# Patient Record
Sex: Male | Born: 1980 | Race: White | Hispanic: No | Marital: Married | Smoking: Current every day smoker
Health system: Southern US, Community
[De-identification: ages and names within clinical notes are randomized; demographics above are authoritative.]

## PROBLEM LIST (undated history)

## (undated) HISTORY — PX: MIDDLE EAR SURGERY: SHX713

## (undated) HISTORY — PX: HERNIA REPAIR: SHX51

## (undated) HISTORY — PX: FOOT SURGERY: SHX648

---

## 2015-01-18 ENCOUNTER — Telehealth: Payer: Self-pay | Admitting: Family Medicine

## 2015-01-18 ENCOUNTER — Ambulatory Visit (INDEPENDENT_AMBULATORY_CARE_PROVIDER_SITE_OTHER): Payer: BLUE CROSS/BLUE SHIELD | Admitting: Physician Assistant

## 2015-01-18 ENCOUNTER — Encounter (INDEPENDENT_AMBULATORY_CARE_PROVIDER_SITE_OTHER): Payer: Self-pay

## 2015-01-18 ENCOUNTER — Ambulatory Visit (INDEPENDENT_AMBULATORY_CARE_PROVIDER_SITE_OTHER): Payer: BLUE CROSS/BLUE SHIELD

## 2015-01-18 ENCOUNTER — Encounter: Payer: Self-pay | Admitting: Physician Assistant

## 2015-01-18 VITALS — BP 137/83 | HR 94 | Temp 97.3°F | Ht 69.0 in | Wt 155.0 lb

## 2015-01-18 DIAGNOSIS — X501XXA Overexertion from prolonged static or awkward postures, initial encounter: Secondary | ICD-10-CM

## 2015-01-18 DIAGNOSIS — T1490 Injury, unspecified: Secondary | ICD-10-CM

## 2015-01-18 DIAGNOSIS — M25561 Pain in right knee: Secondary | ICD-10-CM

## 2015-01-18 DIAGNOSIS — M25461 Effusion, right knee: Secondary | ICD-10-CM | POA: Diagnosis not present

## 2015-01-18 MED ORDER — MELOXICAM 15 MG PO TABS: 15.0000 mg | ORAL_TABLET | Freq: Every day | ORAL | Status: DC

## 2015-01-18 NOTE — Patient Instructions (Signed)

## 2015-01-18 NOTE — Telephone Encounter (Signed)
Appt given for today per patients request 

## 2015-01-18 NOTE — Progress Notes (Signed)
   Subjective:    Patient ID: Corey Dodson, male    DOB: April 04, 1981, 34 y.o.   MRN: [id number]  HPI 34 y/o male presents with right knee pain and swelling s/p an incident x 2 days ago while he was on a roof. He went to grab shingles and his "knee cap rolled like bones grinding together". No immediate pain. Few hours later he noticed mild pain, took tylenol. Woke up yesterday morning with sever pain and swelling in right knee. He presents on crutches today. Pain is worse with standing or leg extension. Has took 8 375mg  Tylenol today with no relief. Applied ice several times yesterday . No h/o knee injury.    Review of Systems  Constitutional: Negative.   Musculoskeletal: Positive for joint swelling (right knee edema) and arthralgias (right knee ).       Unable to extend knee   Neurological: Positive for numbness (intermittent numbness in anterior thigh, superior lateral pole of patella ).       Objective:   Physical Exam  Musculoskeletal: He exhibits edema (2+ edema superior to patella into quadriceps of RLE) and tenderness (lateral superior pole of patella, ).  Inability to perform right knee extension or flexion d/t pain in inferior quadricep Negative for patella sublaxation           Assessment & Plan:  1. Injury caused by twisting  - DG Knee 1-2 Views Right; Future - MR Knee Right Wo Contrast; Future - meloxicam (MOBIC) 15 MG tablet; Take 1 tablet (15 mg total) by mouth daily.  Dispense: 30 tablet; Refill: 0  2. Right knee pain  - MR Knee Right Wo Contrast; Future - meloxicam (MOBIC) 15 MG tablet; Take 1 tablet (15 mg total) by mouth daily.  Dispense: 30 tablet; Refill: 0 - Continue using crutches - Rest, ice , ace wrap and elevation   3. Knee effusion, right  - MR Knee Right Wo Contrast; Future - meloxicam (MOBIC) 15 MG tablet; Take 1 tablet (15 mg total) by mouth daily.  Dispense: 30 tablet; Refill: 0  Will call with appointment for MRI    RTO 2 weeks    Skarlet Lyons A. Chauncey Reading PA-C

## 2015-02-10 ENCOUNTER — Ambulatory Visit (HOSPITAL_COMMUNITY): Payer: BLUE CROSS/BLUE SHIELD

## 2015-11-14 IMAGING — CR DG KNEE 1-2V*R*
2 series · 2 of 2 positions shown · non-contrast
Comparison: None.

CLINICAL DATA: The patient felt a pop in his right knee 01/16/2015.
Right knee pain. Initial encounter.

EXAM:
RIGHT KNEE - 1-2 VIEW

[view not recorded (1 of 2)]
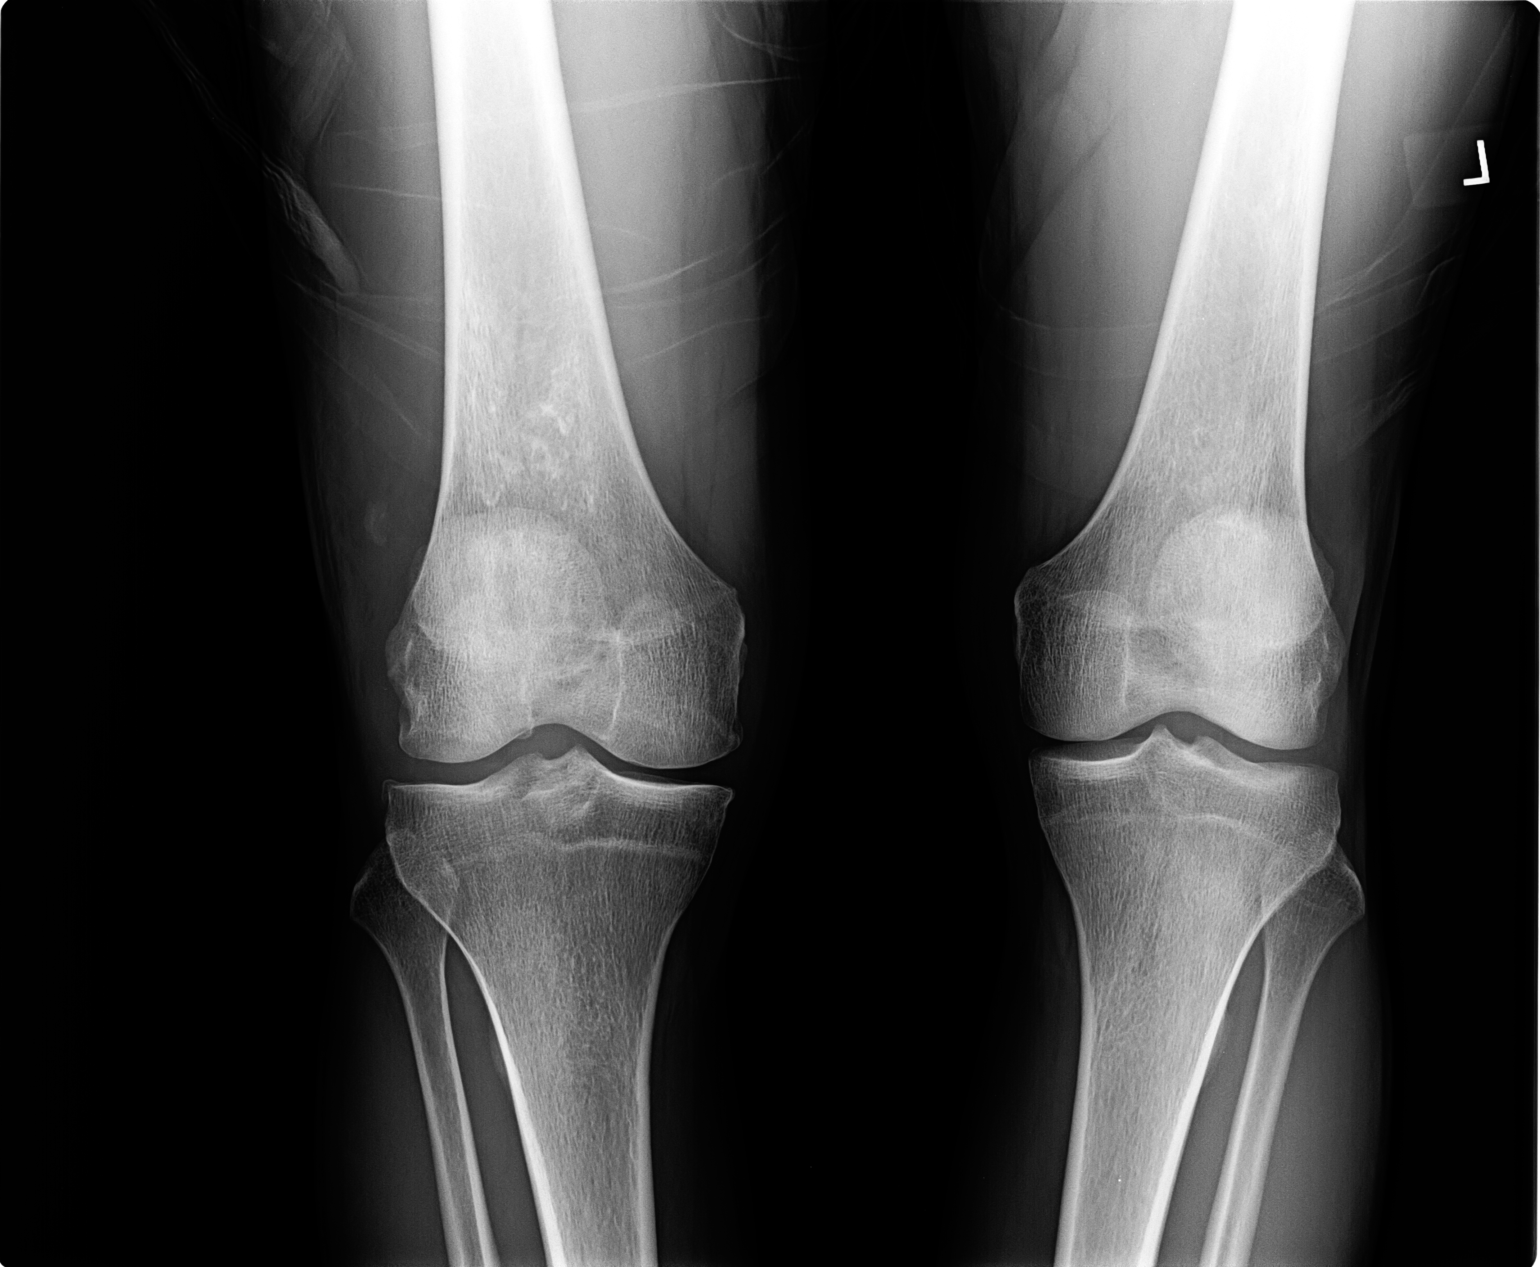

[view not recorded (2 of 2)]
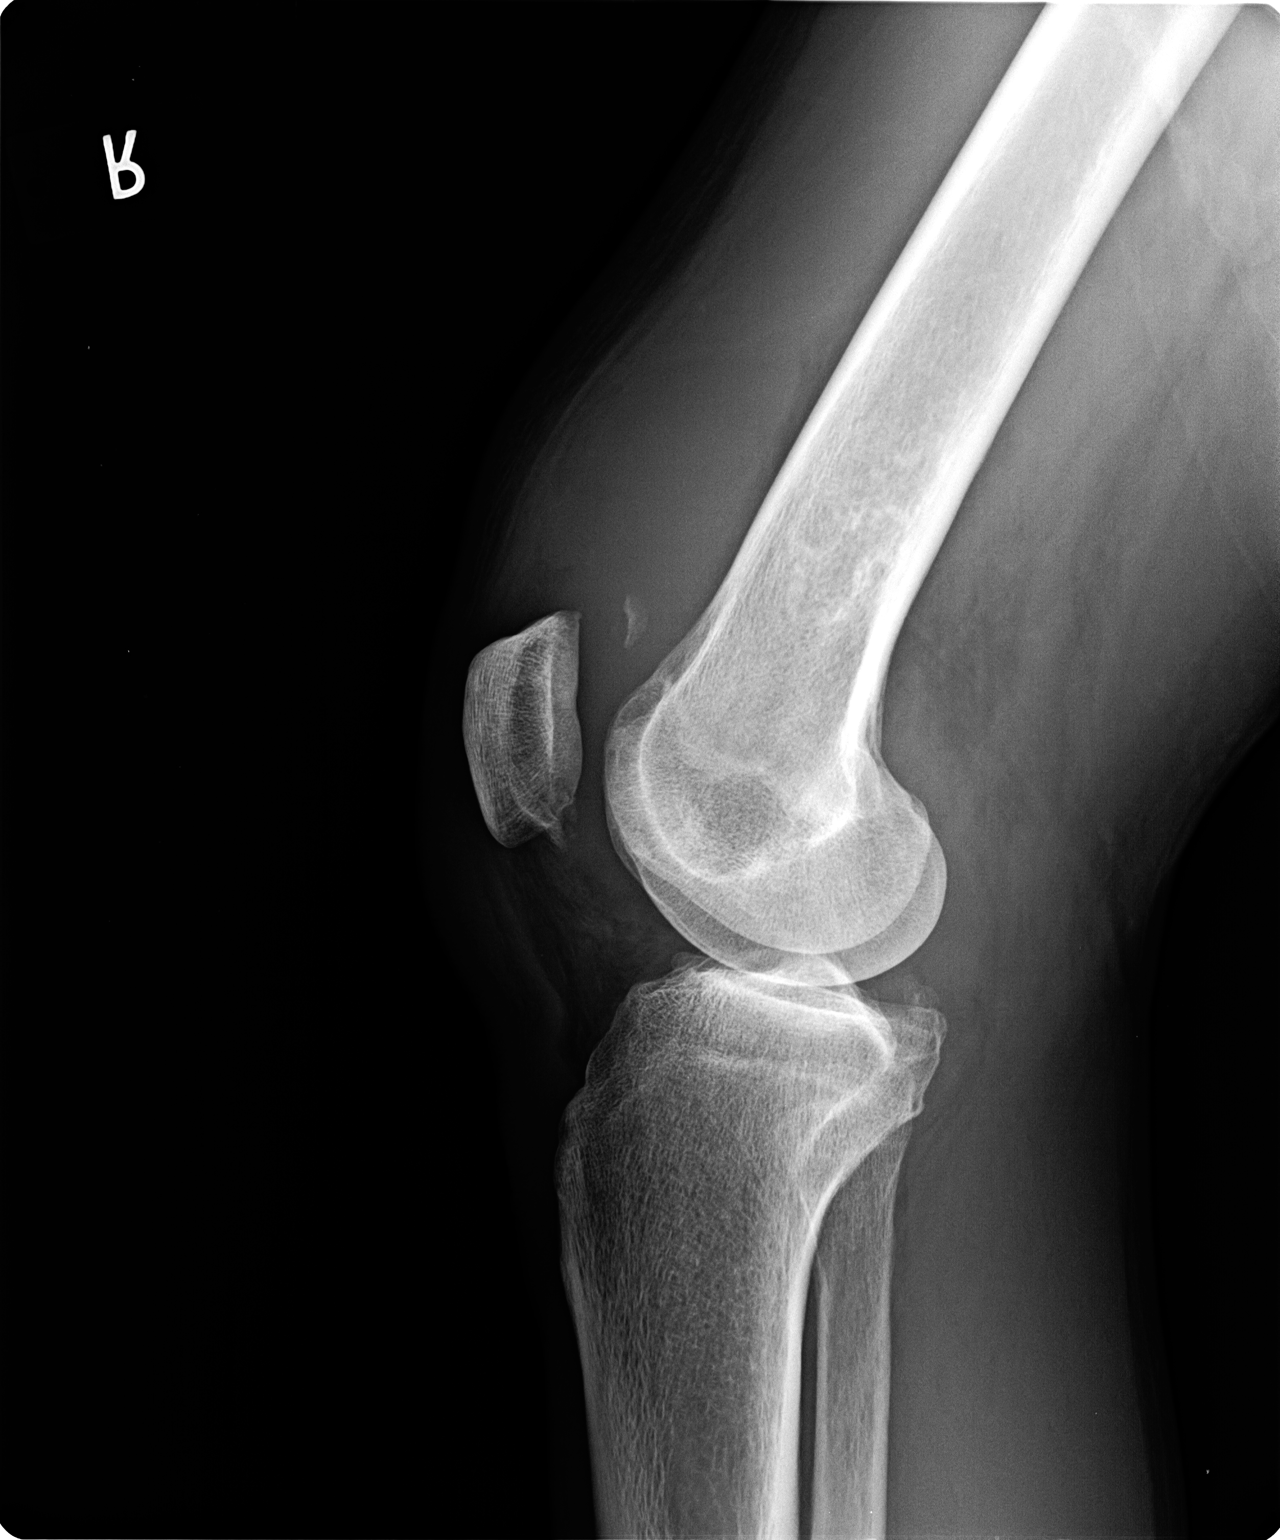

[2 of 2 positions shown; findings below may reference images not displayed]

FINDINGS: The patient has a large right knee joint effusion. A loose body
projecting along the lateral aspect of the joint in the
patellofemoral compartment measures 1 cm in diameter. No fracture is
identified. There is a subtle area of sclerosis in the medullary
space of the distal left femur with an appearance most consistent
with a medullary infarct. Joint spaces are preserved. Small
osteophytes are seen about the knee.
IMPRESSION: Large knee joint effusion.  No fracture is identified.

1 cm loose body projects in the lateral aspect of the patellofemoral
compartment.

Likely medullary infarct distal right femur.

## 2017-06-24 ENCOUNTER — Encounter: Payer: BLUE CROSS/BLUE SHIELD | Admitting: Family Medicine

## 2017-06-25 ENCOUNTER — Encounter: Payer: Self-pay | Admitting: Pediatrics

## 2018-05-28 DIAGNOSIS — Z23 Encounter for immunization: Secondary | ICD-10-CM | POA: Diagnosis not present

## 2018-10-28 ENCOUNTER — Telehealth: Payer: Self-pay | Admitting: *Deleted

## 2018-10-28 NOTE — Telephone Encounter (Signed)
Patient called stating he has noticed small amounts of blood in his stool.  He states it has happened intermittently, but the last time was 2 years ago.  He has a history of hemorrhoids.  He has not been seen here in 3.5 years.  Scheduled him as a new patient tomorrow to be evaluated by Dr. Louanne Skye.

## 2018-10-29 ENCOUNTER — Ambulatory Visit: Payer: Medicaid Other | Admitting: Family Medicine

## 2018-10-29 ENCOUNTER — Other Ambulatory Visit: Payer: Self-pay

## 2018-10-29 ENCOUNTER — Encounter: Payer: Self-pay | Admitting: Family Medicine

## 2018-10-29 VITALS — BP 115/82 | HR 78 | Temp 97.6°F | Ht 69.0 in | Wt 161.0 lb

## 2018-10-29 DIAGNOSIS — Z131 Encounter for screening for diabetes mellitus: Secondary | ICD-10-CM

## 2018-10-29 DIAGNOSIS — Z13220 Encounter for screening for lipoid disorders: Secondary | ICD-10-CM

## 2018-10-29 DIAGNOSIS — K640 First degree hemorrhoids: Secondary | ICD-10-CM | POA: Diagnosis not present
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
## 2018-10-30 LAB — LIPID PANEL
Chol/HDL Ratio: 4.8 ratio (ref 0.0–5.0)
Cholesterol, Total: 158 mg/dL (ref 100–199)
HDL: 33 mg/dL — ABNORMAL LOW (ref >39)
LDL Calculated: 95 mg/dL (ref 0–99)
Triglycerides: 152 mg/dL — ABNORMAL HIGH (ref 0–149)
VLDL CHOLESTEROL CAL: 30 mg/dL (ref 5–40)

## 2018-10-30 LAB — CMP14+EGFR
A/G RATIO: 2.7 — AB (ref 1.2–2.2)
ALBUMIN: 4.8 g/dL (ref 4.0–5.0)
ALT: 21 IU/L (ref 0–44)
AST: 26 IU/L (ref 0–40)
Alkaline Phosphatase: 103 IU/L (ref 39–117)
BILIRUBIN TOTAL: 0.4 mg/dL (ref 0.0–1.2)
BUN / CREAT RATIO: 12 (ref 9–20)
BUN: 12 mg/dL (ref 6–20)
CHLORIDE: 101 mmol/L (ref 96–106)
CO2: 24 mmol/L (ref 20–29)
Calcium: 9.5 mg/dL (ref 8.7–10.2)
Creatinine, Ser: 1.04 mg/dL (ref 0.76–1.27)
GFR calc Af Amer: 104 mL/min/{1.73_m2} (ref >59)
GFR calc non Af Amer: 90 mL/min/{1.73_m2} (ref >59)
Globulin, Total: 1.8 g/dL (ref 1.5–4.5)
Glucose: 82 mg/dL (ref 65–99)
POTASSIUM: 4.3 mmol/L (ref 3.5–5.2)
Sodium: 143 mmol/L (ref 134–144)
TOTAL PROTEIN: 6.6 g/dL (ref 6.0–8.5)

## 2018-10-30 LAB — CBC WITH DIFFERENTIAL/PLATELET
BASOS: 1 %
Basophils Absolute: 0.1 10*3/uL (ref 0.0–0.2)
EOS (ABSOLUTE): 0.1 10*3/uL (ref 0.0–0.4)
Eos: 1 %
HEMOGLOBIN: 15.9 g/dL (ref 13.0–17.7)
Hematocrit: 45.8 % (ref 37.5–51.0)
IMMATURE GRANS (ABS): 0 10*3/uL (ref 0.0–0.1)
Immature Granulocytes: 0 %
LYMPHS ABS: 2.4 10*3/uL (ref 0.7–3.1)
LYMPHS: 28 %
MCH: 29.2 pg (ref 26.6–33.0)
MCHC: 34.7 g/dL (ref 31.5–35.7)
MCV: 84 fL (ref 79–97)
MONOCYTES: 6 %
Monocytes Absolute: 0.5 10*3/uL (ref 0.1–0.9)
NEUTROS ABS: 5.6 10*3/uL (ref 1.4–7.0)
Neutrophils: 64 %
Platelets: 315 10*3/uL (ref 150–450)
RBC: 5.44 x10E6/uL (ref 4.14–5.80)
RDW: 12.5 % (ref 11.6–15.4)
WBC: 8.7 10*3/uL (ref 3.4–10.8)

## 2019-05-17 DIAGNOSIS — Z23 Encounter for immunization: Secondary | ICD-10-CM | POA: Diagnosis not present

## 2020-01-15 DIAGNOSIS — R05 Cough: Secondary | ICD-10-CM | POA: Diagnosis not present

## 2020-01-15 DIAGNOSIS — J029 Acute pharyngitis, unspecified: Secondary | ICD-10-CM | POA: Diagnosis not present

## 2020-01-18 ENCOUNTER — Ambulatory Visit (INDEPENDENT_AMBULATORY_CARE_PROVIDER_SITE_OTHER): Payer: Medicaid Other | Admitting: Nurse Practitioner

## 2020-01-18 ENCOUNTER — Encounter: Payer: Self-pay | Admitting: Nurse Practitioner

## 2020-01-18 DIAGNOSIS — J069 Acute upper respiratory infection, unspecified: Secondary | ICD-10-CM

## 2020-01-18 MED ORDER — FLUTICASONE PROPIONATE 50 MCG/ACT NA SUSP: 2.0000 | Freq: Every day | NASAL | 6 refills | Status: DC

## 2020-01-18 NOTE — Progress Notes (Signed)
Virtual Visit via telephone Note Due to COVID-19 pandemic this visit was conducted virtually. This visit type was conducted due to national recommendations for restrictions regarding the COVID-19 Pandemic (e.g. social distancing, sheltering in place) in an effort to limit this patient's exposure and mitigate transmission in our community. All issues noted in this document were discussed and addressed.  A physical exam was not performed with this format.  I connected with Arlan Organ on 01/18/20 at 8:35 by telephone and verified that I am speaking with the correct person using two identifiers. Tovia Kisner is currently located at home and no one is currently with  him during visit. The provider, Mary-Margaret Daphine Deutscher, FNP is located in their office at time of visit.  I discussed the limitations, risks, security and privacy concerns of performing an evaluation and management service by telephone and the availability of in person appointments. I also discussed with the patient that there may be a patient responsible charge related to this service. The patient expressed understanding and agreed to proceed.   History and Present Illness:   Chief Complaint: URI   HPI Patient calls in c/o fever for 4-5 days  with ear ache. Slight cough. Slight congestion.  Started about 3 days ago   Review of Systems  Constitutional: Negative for chills and fever.  HENT: Positive for congestion. Negative for ear pain (just feels stopped up) and sinus pain.   Respiratory: Positive for cough (slight). Negative for sputum production.   Neurological: Negative for dizziness and headaches.  All other systems reviewed and are negative.    Observations/Objective: Alert and oriented- answers all questions appropriately No distress    Assessment and Plan: Arlan Organ in today with chief complaint of URI   1. Acute URI 1. Take meds as prescribed 2. Use a cool mist humidifier especially during the  winter months and when heat has been humid. 3. Use saline nose sprays frequently 4. Saline irrigations of the nose can be very helpful if done frequently.  * 4X daily for 1 week*  * Use of a nettie pot can be helpful with this. Follow directions with this* 5. Drink plenty of fluids 6. Keep thermostat turn down low 7.For any cough or congestion  Use plain Mucinex- regular strength or max strength is fine   * Children- consult with Pharmacist for dosing 8. For fever or aces or pains- take tylenol or ibuprofen appropriate for age and weight.  * for fevers greater than 101 orally you may alternate ibuprofen and tylenol every  3 hours.   Meds ordered this encounter  Medications  . fluticasone (FLONASE) 50 MCG/ACT nasal spray    Sig: Place 2 sprays into both nostrils daily.    Dispense:  16 g    Refill:  6    Order Specific Question:   Supervising Provider    Answer:   Arville Care A [[id number]]           Follow Up Instructions: prn    I discussed the assessment and treatment plan with the patient. The patient was provided an opportunity to ask questions and all were answered. The patient agreed with the plan and demonstrated an understanding of the instructions.   The patient was advised to call back or seek an in-person evaluation if the symptoms worsen or if the condition fails to improve as anticipated.  The above assessment and management plan was discussed with the patient. The patient verbalized understanding of and has agreed  to the management plan. Patient is aware to call the clinic if symptoms persist or worsen. Patient is aware when to return to the clinic for a follow-up visit. Patient educated on when it is appropriate to go to the emergency department.   Time call ended:  8:49  I provided 14 minutes of non-face-to-face time during this encounter.    Mary-Margaret Hassell Done, FNP

## 2020-06-01 ENCOUNTER — Ambulatory Visit (INDEPENDENT_AMBULATORY_CARE_PROVIDER_SITE_OTHER): Payer: Medicaid Other

## 2020-06-01 ENCOUNTER — Other Ambulatory Visit: Payer: Self-pay

## 2020-06-01 DIAGNOSIS — Z23 Encounter for immunization: Secondary | ICD-10-CM

## 2020-06-01 NOTE — Progress Notes (Signed)
° °  Covid-19 Vaccination Clinic  Name:  Corey Dodson    MRN: [id number] DOB: [birth year]  06/01/2020  [name] was observed post Covid-19 immunization for 15 minutes without incident. He was provided with Vaccine Information Sheet and instruction to access the V-Safe system.   [name] was instructed to call 911 with any severe reactions post vaccine:  Difficulty breathing   Swelling of face and throat   A fast heartbeat   A bad rash all over body   Dizziness and weakness   Immunizations Administered    Name Date Dose VIS Date Route   Moderna COVID-19 Vaccine 06/01/2020  1:42 PM 0.5 mL 07/2019 Intramuscular   Manufacturer: Moderna   Lot: [id number]   NDC: 55974-163-84

## 2020-07-06 ENCOUNTER — Ambulatory Visit: Payer: Medicaid Other

## 2020-07-19 DIAGNOSIS — Z20822 Contact with and (suspected) exposure to covid-19: Secondary | ICD-10-CM | POA: Diagnosis not present

## 2020-07-25 DIAGNOSIS — Z20822 Contact with and (suspected) exposure to covid-19: Secondary | ICD-10-CM | POA: Diagnosis not present

## 2020-09-28 ENCOUNTER — Ambulatory Visit: Payer: Medicaid Other

## 2022-01-04 ENCOUNTER — Ambulatory Visit: Payer: Medicaid Other | Admitting: Family Medicine

## 2022-01-04 ENCOUNTER — Encounter: Payer: Self-pay | Admitting: Family Medicine

## 2022-01-04 VITALS — BP 123/75 | HR 78 | Temp 97.9°F | Ht 69.0 in | Wt 164.0 lb

## 2022-01-04 DIAGNOSIS — Z0001 Encounter for general adult medical examination with abnormal findings: Secondary | ICD-10-CM

## 2022-01-04 DIAGNOSIS — R1032 Left lower quadrant pain: Secondary | ICD-10-CM

## 2022-01-04 DIAGNOSIS — Z0000 Encounter for general adult medical examination without abnormal findings: Secondary | ICD-10-CM | POA: Diagnosis not present

## 2022-01-04 DIAGNOSIS — S139XXA Sprain of joints and ligaments of unspecified parts of neck, initial encounter: Secondary | ICD-10-CM

## 2022-01-04 LAB — MICROSCOPIC EXAMINATION
Epithelial Cells (non renal): NONE SEEN /hpf (ref 0–10)
RBC, Urine: NONE SEEN /hpf (ref 0–2)
Renal Epithel, UA: NONE SEEN /hpf

## 2022-01-04 LAB — URINALYSIS, COMPLETE
Bilirubin, UA: NEGATIVE
Glucose, UA: NEGATIVE
Leukocytes,UA: NEGATIVE
Nitrite, UA: NEGATIVE
RBC, UA: NEGATIVE
Specific Gravity, UA: 1.02 (ref 1.005–1.030)
Urobilinogen, Ur: 0.2 mg/dL (ref 0.2–1.0)
pH, UA: 7 (ref 5.0–7.5)

## 2022-01-04 MED ORDER — CYCLOBENZAPRINE HCL 10 MG PO TABS: 10.0000 mg | ORAL_TABLET | Freq: Three times a day (TID) | ORAL | 0 refills | Status: DC | PRN

## 2022-01-04 NOTE — Progress Notes (Signed)
BP 123/75   Pulse 78   Temp 97.9 F (36.6 C)   Ht _0  (1.753 m)   Wt 164 lb (74.4 kg)   SpO2 98%   BMI 24.22 kg/m    Subjective:   Patient ID: Corey Dodson, male    DOB: 12-11-1980, 41 y.o.   MRN: [id number]  HPI: Corey Dodson is a 41 y.o. male presenting on 01/04/2022 for Medical Management of Chronic Issues (CPE)   HPI Physical exam Patient denies any chest pain, shortness of breath, headaches or vision issues, abdominal complaints, diarrhea, nausea, vomiting.  Patient's biggest complaint at this time is his neck pain.  He has been on and off and on for the past couple months.  He says it is worse at night and hurts down both sides of his neck and sometimes comes up and causes headaches as well.  He denies any specific trauma that brought it on.  He has been trying some Tylenol and ibuprofen and they do not seem to be helping.  He does feel a popping and grinding that will then cause him to be sore and have pain.  He is also has intermittent left testicular pain or just above the scrotum where it hurts in his groin and he was concerned about whether or not he has hernia.  He denies any pain with urination or intercourse.  He denies pain in the testicle itself but just above it in his groin.  He says it happens very infrequently over the past year but just that he has noticed it every now and then.  It does not hurt today.  Relevant past medical, surgical, family and social history Reviewed and updated as indicated. Interim medical history since our last visit reviewed. Allergies and medications reviewed and updated.  Review of Systems  Constitutional:  Negative for chills and fever.  Eyes:  Negative for visual disturbance.  Respiratory:  Negative for shortness of breath and wheezing.   Cardiovascular:  Negative for chest pain and leg swelling.  Genitourinary:  Positive for testicular pain. Negative for decreased urine volume, dysuria, frequency, hematuria and urgency.   Musculoskeletal:  Positive for arthralgias, myalgias and neck pain. Negative for back pain, gait problem and neck stiffness.  Skin:  Negative for rash.  Neurological:  Positive for headaches.  All other systems reviewed and are negative.  Per HPI unless specifically indicated above   Allergies as of 01/04/2022   No Known Allergies      Medication List        Accurate as of January 04, 2022  9:58 AM. If you have any questions, ask your nurse or doctor.          STOP taking these medications    fluticasone 50 MCG/ACT nasal spray Commonly known as: FLONASE Stopped by: Fransisca Kaufmann Kaley Jutras, MD       TAKE these medications    acetaminophen 500 MG tablet Commonly known as: TYLENOL Take 500 mg by mouth every 6 (six) hours as needed.   cyclobenzaprine 10 MG tablet Commonly known as: FLEXERIL Take 1 tablet (10 mg total) by mouth 3 (three) times daily as needed for muscle spasms. Started by: Fransisca Kaufmann Masiah Woody, MD         Objective:   BP 123/75   Pulse 78   Temp 97.9 F (36.6 C)   Ht _1  (1.753 m)   Wt 164 lb (74.4 kg)   SpO2 98%   BMI 24.22 kg/m  Wt Readings from Last 3 Encounters:  01/04/22 164 lb (74.4 kg)  10/29/18 161 lb (73 kg)  01/18/15 155 lb (70.3 kg)    Physical Exam Vitals and nursing note reviewed.  Constitutional:      General: He is not in acute distress.    Appearance: He is well-developed. He is not diaphoretic.  Eyes:     General: No scleral icterus.    Conjunctiva/sclera: Conjunctivae normal.  Neck:     Thyroid: No thyromegaly.  Cardiovascular:     Rate and Rhythm: Normal rate and regular rhythm.     Heart sounds: Normal heart sounds. No murmur heard. Pulmonary:     Effort: Pulmonary effort is normal. No respiratory distress.     Breath sounds: Normal breath sounds. No wheezing.  Abdominal:     Hernia: There is no hernia in the left inguinal area or right inguinal area.  Genitourinary:    Testes:        Right: Mass, tenderness,  swelling, testicular hydrocele or varicocele not present. Right testis is descended. Cremasteric reflex is present.         Left: Mass, tenderness, swelling, testicular hydrocele or varicocele not present. Left testis is descended. Cremasteric reflex is present.     Musculoskeletal:        General: Normal range of motion.     Cervical back: Neck supple. Tenderness and crepitus present. No deformity, signs of trauma, spasms, torticollis or bony tenderness. Pain with movement present. Normal range of motion.       Back:  Lymphadenopathy:     Cervical: No cervical adenopathy.  Skin:    General: Skin is warm and dry.     Findings: No rash.  Neurological:     Mental Status: He is alert and oriented to person, place, and time.     Coordination: Coordination normal.  Psychiatric:        Behavior: Behavior normal.      Assessment & Plan:   Problem List Items Addressed This Visit   None Visit Diagnoses     Physical exam    -  Primary   Relevant Orders   CBC with Differential/Platelet   CMP14+EGFR   Lipid panel   Left inguinal pain       Relevant Orders   Urinalysis, Complete   Urine Culture   Neck sprain, initial encounter       Relevant Medications   cyclobenzaprine (FLEXERIL) 10 MG tablet       We will do urinalysis to see if he has any kind of infection from the groin.  Likely muscular strain as well.  Patient's neck is muscular strain, will give muscle relaxer. Follow up plan: Return in about 1 year (around 01/05/2023), or if symptoms worsen or fail to improve, for Physical exam.  Counseling provided for all of the vaccine components Orders Placed This Encounter  Procedures   Urine Culture   Urinalysis, Complete   CBC with Differential/Platelet   CMP14+EGFR   Lipid panel    Caryl Pina, MD [facility] 01/04/2022, 9:58 AM

## 2022-01-05 LAB — CMP14+EGFR
ALT: 17 IU/L (ref 0–44)
AST: 25 IU/L (ref 0–40)
Albumin/Globulin Ratio: 2.2 (ref 1.2–2.2)
Albumin: 4.6 g/dL (ref 4.0–5.0)
Alkaline Phosphatase: 97 IU/L (ref 44–121)
BUN/Creatinine Ratio: 7 — ABNORMAL LOW (ref 9–20)
BUN: 7 mg/dL (ref 6–24)
Bilirubin Total: 0.4 mg/dL (ref 0.0–1.2)
CO2: 20 mmol/L (ref 20–29)
Calcium: 9.4 mg/dL (ref 8.7–10.2)
Chloride: 105 mmol/L (ref 96–106)
Creatinine, Ser: 0.97 mg/dL (ref 0.76–1.27)
Globulin, Total: 2.1 g/dL (ref 1.5–4.5)
Glucose: 99 mg/dL (ref 70–99)
Potassium: 4.2 mmol/L (ref 3.5–5.2)
Sodium: 138 mmol/L (ref 134–144)
Total Protein: 6.7 g/dL (ref 6.0–8.5)
eGFR: 100 mL/min/{1.73_m2} (ref >59)

## 2022-01-05 LAB — CBC WITH DIFFERENTIAL/PLATELET
Basophils Absolute: 0.1 10*3/uL (ref 0.0–0.2)
Basos: 1 %
EOS (ABSOLUTE): 0.3 10*3/uL (ref 0.0–0.4)
Eos: 3 %
Hematocrit: 46.9 % (ref 37.5–51.0)
Hemoglobin: 16.1 g/dL (ref 13.0–17.7)
Immature Grans (Abs): 0 10*3/uL (ref 0.0–0.1)
Immature Granulocytes: 0 %
Lymphocytes Absolute: 2.1 10*3/uL (ref 0.7–3.1)
Lymphs: 20 %
MCH: 29.5 pg (ref 26.6–33.0)
MCHC: 34.3 g/dL (ref 31.5–35.7)
MCV: 86 fL (ref 79–97)
Monocytes Absolute: 0.6 10*3/uL (ref 0.1–0.9)
Monocytes: 6 %
Neutrophils Absolute: 7.3 10*3/uL — ABNORMAL HIGH (ref 1.4–7.0)
Neutrophils: 70 %
Platelets: 251 10*3/uL (ref 150–450)
RBC: 5.46 x10E6/uL (ref 4.14–5.80)
RDW: 12.9 % (ref 11.6–15.4)
WBC: 10.3 10*3/uL (ref 3.4–10.8)

## 2022-01-05 LAB — URINE CULTURE: Organism ID, Bacteria: NO GROWTH

## 2022-01-05 LAB — LIPID PANEL
Chol/HDL Ratio: 5.9 ratio — ABNORMAL HIGH (ref 0.0–5.0)
Cholesterol, Total: 182 mg/dL (ref 100–199)
HDL: 31 mg/dL — ABNORMAL LOW (ref >39)
LDL Chol Calc (NIH): 109 mg/dL — ABNORMAL HIGH (ref 0–99)
Triglycerides: 239 mg/dL — ABNORMAL HIGH (ref 0–149)
VLDL Cholesterol Cal: 42 mg/dL — ABNORMAL HIGH (ref 5–40)

## 2022-01-08 NOTE — Progress Notes (Signed)
Patient returning call. Please call back

## 2022-09-05 ENCOUNTER — Ambulatory Visit: Payer: BC Managed Care – PPO | Admitting: Family Medicine

## 2022-09-06 ENCOUNTER — Encounter: Payer: Self-pay | Admitting: Family Medicine

## 2022-09-06 ENCOUNTER — Ambulatory Visit (INDEPENDENT_AMBULATORY_CARE_PROVIDER_SITE_OTHER): Payer: BC Managed Care – PPO | Admitting: Family Medicine

## 2022-09-06 VITALS — BP 106/56 | HR 85 | Temp 97.4°F | Ht 69.0 in | Wt 156.1 lb

## 2022-09-06 DIAGNOSIS — K439 Ventral hernia without obstruction or gangrene: Secondary | ICD-10-CM

## 2022-09-06 DIAGNOSIS — Z23 Encounter for immunization: Secondary | ICD-10-CM

## 2022-09-06 NOTE — Patient Instructions (Signed)
Hernia, Adult     A hernia is the bulging of an organ or tissue through a weak spot in the muscles of the abdomen. Hernias develop most often near the belly button (navel) or the area where the leg meets the lower abdomen (groin). Common types of hernias include: Incisional hernia. This type bulges through a scar from an abdominal surgery. Umbilical hernia. This type develops near the navel. Inguinal hernia. This type develops in the groin or scrotum. Femoral hernia. This type develops below the groin, in the upper thigh area. Hiatal hernia. This type occurs when part of the stomach slides above the muscle that separates the abdomen from the chest (diaphragm). What are the causes? This condition may be caused by: Heavy lifting. Coughing over a long period of time. Straining to have a bowel movement. Constipation can lead to straining. An incision made during abdominal surgery. A physical problem that is present at birth (congenital defect). Being overweight or obese. Smoking. Excess fluid in the abdomen. Undescended testicles in males. What are the signs or symptoms? The main symptom is a skin-colored, rounded bulge in the area of the hernia. However, a bulge may not always be present. It may grow bigger or be more visible when you cough or strain (such as when lifting something heavy). A hernia that can be pushed back into the abdomen (is reducible) rarely causes pain. A hernia that cannot be pushed back into the abdomen (is incarcerated) may lose its blood supply (become strangulated). A hernia that is incarcerated may cause: Pain. Fever. Nausea and vomiting. Swelling. Constipation. How is this diagnosed? A hernia may be diagnosed based on: Your symptoms and medical history. A physical exam. Your health care provider may ask you to cough or move in certain ways to see if the hernia becomes visible. Imaging tests, such as: X-rays. Ultrasound. CT scan. How is this treated? A  hernia that is small and painless may not need to be treated. A hernia that is large or painful may be treated with surgery. Inguinal hernias may be treated with surgery to prevent incarceration or strangulation. Strangulated hernias are always treated with surgery because the strangulation causes a lack of blood supply to the trapped organ or tissue. Surgery to treat a hernia involves pushing the bulge back into place and repairing the weak area of the muscle or abdominal wall. Follow these instructions at home: Activity Avoid straining. Do not lift anything that is heavier than 10 lb (4.5 kg), or the limit that you are told, until your health care provider says that it is safe. When lifting heavy objects, lift with your leg muscles, not your back muscles. Preventing constipation Take actions to prevent constipation. Constipation leads to straining with bowel movements, which can make a hernia worse or cause a hernia repair to break down. Your health care provider may recommend that you take these actions to prevent or treat constipation: Drink enough fluid to keep your urine pale yellow. Take over-the-counter or prescription medicines. Eat foods that are high in fiber, such as beans, whole grains, and fresh fruits and vegetables. Limit foods that are high in fat and processed sugars, such as fried or sweet foods. General instructions When coughing, try to cough gently. You may try to push the hernia back in place by very gently pressing on it while lying down. Do not try to force the bulge back in if it will not push in easily. If you are overweight, work with your health care provider  to lose weight safely. Do not use any products that contain nicotine or tobacco. These products include cigarettes, chewing tobacco, and vaping devices, such as e-cigarettes. If you need help quitting, ask your health care provider. If you are scheduled for hernia repair, watch your hernia for any changes in shape,  size, or color. Tell your health care provider about any changes or new symptoms. Take over-the-counter and prescription medicines only as told by your health care provider. Keep all follow-up visits. This is important. Contact a health care provider if: You develop new pain, swelling, or redness around your hernia. You have signs of constipation, such as: Fewer bowel movements in a week than normal. Difficulty having a bowel movement. Stools that are dry, hard, or larger than normal. Get help right away if: You have a fever or chills. You have abdominal pain that gets worse. You feel nauseous or you vomit. You cannot push the hernia back in place by very gently pressing on it while lying down. Do not try to force the bulge back in if it will not go in easily. The hernia: Changes in shape, size, or color. Feels hard or tender. These symptoms may represent a serious problem that is an emergency. Do not wait to see if the symptoms will go away. Get medical help right away. Call your local emergency services (911 in the [place] Do not drive yourself to the hospital. Summary A hernia is the bulging of an organ or tissue through a weak spot in the muscles of the abdomen. The main symptom is a skin-colored bulge in the hernia area. However, a bulge may not always be present. It may grow bigger or more visible when you cough or strain (such as when having a bowel movement). A hernia that is small and painless may not need to be treated. A hernia that is large or painful may be treated with surgery. Surgery to treat a hernia involves pushing the bulge back into place and repairing the weak part of the abdomen. This information is not intended to replace advice given to you by your health care provider. Make sure you discuss any questions you have with your health care provider. Document Revised: 02/29/2020 Document Reviewed: 02/29/2020 Elsevier Patient Education  2023 [employer]  

## 2022-09-06 NOTE — Progress Notes (Signed)
   Acute Office Visit  Subjective:     Patient ID: Corey Dodson, male    DOB: Aug 28, 1980, 42 y.o.   MRN: [id number]  Chief Complaint  Patient presents with   Hernia    HPI Patient is in today for a hernia. This is in his epigastric region. He noticed a small protrusion there after doing some heavy lifting 4 days ago. He denies pain or discoloration. The area is soft. No nausea or vomiting.    ROS As per HPI.      Objective:    BP (!) 106/56   Pulse 85   Temp (!) 97.4 F (36.3 C) (Temporal)   Ht 5\' 9"  (1.753 m)   Wt 156 lb 2 oz (70.8 kg)   SpO2 94%   BMI 23.06 kg/m    Physical Exam Vitals and nursing note reviewed.  Constitutional:      General: He is not in acute distress.    Appearance: He is not ill-appearing, toxic-appearing or diaphoretic.  Pulmonary:     Effort: Pulmonary effort is normal. No respiratory distress.  Abdominal:     Palpations: Abdomen is soft.     Comments: Small epigastric hernia noted. Soft and reducible.   Skin:    General: Skin is warm and dry.  Neurological:     General: No focal deficit present.     Mental Status: He is alert and oriented to person, place, and time.  Psychiatric:        Mood and Affect: Mood normal.        Behavior: Behavior normal.        Thought Content: Thought content normal.        Judgment: Judgment normal.     No results found for any visits on 09/06/22.      Assessment & Plan:   Corey Dodson was seen today for hernia.  Diagnoses and all orders for this visit:  Epigastric hernia Soft, reducible. No pain. Discussed monitoring symptoms for now. Discussed limited lifting. Discussed referral to general surgery if symptoms worsens. Discussed when to seek emergency care.   Need for immunization against influenza -     Flu Vaccine QUAD 48mo+IM (Fluarix, Fluzone & Alfiuria Quad PF)  The patient indicates understanding of these issues and agrees with the plan.   Gwenlyn Perking, FNP

## 2024-05-18 ENCOUNTER — Ambulatory Visit (INDEPENDENT_AMBULATORY_CARE_PROVIDER_SITE_OTHER): Admitting: Family Medicine

## 2024-05-18 ENCOUNTER — Encounter: Payer: Self-pay | Admitting: Family Medicine

## 2024-05-18 VITALS — BP 111/77 | HR 76 | Temp 97.8°F | Ht 69.0 in | Wt 157.0 lb

## 2024-05-18 DIAGNOSIS — Z23 Encounter for immunization: Secondary | ICD-10-CM

## 2024-05-18 DIAGNOSIS — Z0001 Encounter for general adult medical examination with abnormal findings: Secondary | ICD-10-CM

## 2024-05-18 DIAGNOSIS — Z716 Tobacco abuse counseling: Secondary | ICD-10-CM | POA: Diagnosis not present

## 2024-05-18 DIAGNOSIS — Z0000 Encounter for general adult medical examination without abnormal findings: Secondary | ICD-10-CM

## 2024-05-18 LAB — LIPID PANEL
Chol/HDL Ratio: 4.4 ratio (ref 0.0–5.0)
Cholesterol, Total: 173 mg/dL (ref 100–199)
HDL: 39 mg/dL — ABNORMAL LOW (ref >39)
LDL Chol Calc (NIH): 110 mg/dL — ABNORMAL HIGH (ref 0–99)
Triglycerides: 132 mg/dL (ref 0–149)
VLDL Cholesterol Cal: 24 mg/dL (ref 5–40)

## 2024-05-18 LAB — CBC WITH DIFFERENTIAL/PLATELET
Basophils Absolute: 0.1 x10E3/uL (ref 0.0–0.2)
Basos: 1 %
EOS (ABSOLUTE): 0.4 x10E3/uL (ref 0.0–0.4)
Eos: 4 %
Hematocrit: 48.7 % (ref 37.5–51.0)
Hemoglobin: 16.7 g/dL (ref 13.0–17.7)
Immature Grans (Abs): 0 x10E3/uL (ref 0.0–0.1)
Immature Granulocytes: 0 %
Lymphocytes Absolute: 1.9 x10E3/uL (ref 0.7–3.1)
Lymphs: 19 %
MCH: 30.5 pg (ref 26.6–33.0)
MCHC: 34.3 g/dL (ref 31.5–35.7)
MCV: 89 fL (ref 79–97)
Monocytes Absolute: 0.6 x10E3/uL (ref 0.1–0.9)
Monocytes: 6 %
Neutrophils Absolute: 7 x10E3/uL (ref 1.4–7.0)
Neutrophils: 70 %
Platelets: 305 x10E3/uL (ref 150–450)
RBC: 5.48 x10E6/uL (ref 4.14–5.80)
RDW: 12.9 % (ref 11.6–15.4)
WBC: 10 x10E3/uL (ref 3.4–10.8)

## 2024-05-18 LAB — CMP14+EGFR
ALT: 13 IU/L (ref 0–44)
AST: 19 IU/L (ref 0–40)
Albumin: 4.3 g/dL (ref 4.1–5.1)
Alkaline Phosphatase: 114 IU/L (ref 47–123)
BUN/Creatinine Ratio: 7 — ABNORMAL LOW (ref 9–20)
BUN: 7 mg/dL (ref 6–24)
Bilirubin Total: 0.4 mg/dL (ref 0.0–1.2)
CO2: 23 mmol/L (ref 20–29)
Calcium: 9.2 mg/dL (ref 8.7–10.2)
Chloride: 104 mmol/L (ref 96–106)
Creatinine, Ser: 0.99 mg/dL (ref 0.76–1.27)
Globulin, Total: 2 g/dL (ref 1.5–4.5)
Glucose: 126 mg/dL — ABNORMAL HIGH (ref 70–99)
Potassium: 4.1 mmol/L (ref 3.5–5.2)
Sodium: 142 mmol/L (ref 134–144)
Total Protein: 6.3 g/dL (ref 6.0–8.5)
eGFR: 96 mL/min/1.73 (ref >59)

## 2024-05-18 NOTE — Progress Notes (Signed)
 BP 111/77   Pulse 76   Temp 97.8 F (36.6 C)   Ht 5' 9 (1.753 m)   Wt 157 lb (71.2 kg)   SpO2 93%   BMI 23.18 kg/m    Subjective:   Patient ID: Corey Dodson, male    DOB: [birth year], 43 y.o.   MRN: [id number]  HPI: Corey Dodson is a 43 y.o. male presenting on 05/18/2024 for Annual Exam   Discussed the use of AI scribe software for clinical note transcription with the patient, who gave verbal consent to proceed.  History of Present Illness   Corey Dodson is a 43 year old male who presents for an annual physical exam.  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 Interim medical history since our last visit reviewed. Allergies and medications reviewed and updated.  Review of Systems  Constitutional:  Negative for chills and fever.  Eyes:  Negative for pain and discharge.  Respiratory:  Negative for cough, shortness of breath and wheezing.   Cardiovascular:  Negative for chest pain, palpitations and leg swelling.  Gastrointestinal:  Negative for abdominal pain, blood in stool, constipation and diarrhea.  Genitourinary:  Negative for dysuria and hematuria.  Musculoskeletal:  Negative for back pain, gait problem and myalgias.  Skin:  Negative for rash.  Neurological:  Negative for dizziness, weakness and headaches.  Psychiatric/Behavioral:  Negative for suicidal ideas.   All other systems reviewed and are  negative.   Per HPI unless specifically indicated above   Allergies as of 05/18/2024   No Known Allergies      Medication List        Accurate as of May 18, 2024  9:17 AM. If you have any questions, ask your nurse or doctor.          STOP taking these medications    acetaminophen 500 MG tablet Commonly known as: TYLENOL Stopped by: Fonda LABOR Wai Litt         Objective:   BP 111/77   Pulse 76   Temp 97.8 F (36.6 C)   Ht 5' 9 (1.753 m)   Wt 157 lb (71.2 kg)   SpO2 93%   BMI 23.18 kg/m   Wt Readings from Last 3 Encounters:  05/18/24 157 lb (71.2 kg)  09/06/22 156 lb 2 oz (70.8 kg)  01/04/22 164 lb (74.4 kg)    Physical Exam Physical Exam   HEENT: External auditory canals clear bilaterally, pharynx normal. CHEST: Lungs clear to auscultation bilaterally. CARDIOVASCULAR: Regular rate and rhythm, no murmurs. ABDOMEN: Abdomen non-tender. EXTREMITIES: No edema, normal reflexes.         Assessment & Plan:   Problem List Items Addressed This Visit   None Visit Diagnoses       Physical exam    -  Primary   Relevant Orders   CBC with Differential/Platelet   CMP14+EGFR   Lipid panel     Encounter for immunization  Relevant Orders   Flu vaccine trivalent PF, 6mos and older(Flulaval,Afluria,Fluarix,Fluzone) (Completed)     Encounter for smoking cessation counseling              Adult Wellness Visit Routine wellness visit with no acute issues. - Perform blood work for renal function, liver function, lipid profile, glucose levels, and CBC.  Tobacco use Continues to smoke, discussed addiction and readiness to quit. - Offer support and pharmacotherapy for smoking cessation when ready.  General Health Maintenance Discussed age-appropriate health maintenance, recommended influenza vaccination and colon cancer screening. - Administer influenza vaccine. - Discuss colon cancer screening options for next year. - Encourage self-testicular  exams every 3-6 months.          Follow up plan: Return in about 1 year (around 05/18/2025), or if symptoms worsen or fail to improve, for Physical exam.  Counseling provided for all of the vaccine components Orders Placed This Encounter  Procedures   Flu vaccine trivalent PF, 6mos and older(Flulaval,Afluria,Fluarix,Fluzone)   CBC with Differential/Platelet   CMP14+EGFR   Lipid panel    Fonda Levins, MD [facility] 05/18/2024, 9:17 AM

## 2024-05-25 ENCOUNTER — Ambulatory Visit: Payer: Self-pay | Admitting: Family Medicine

## 2024-08-11 ENCOUNTER — Encounter: Payer: Self-pay | Admitting: Nurse Practitioner

## 2024-08-11 ENCOUNTER — Telehealth: Admitting: Nurse Practitioner

## 2024-08-11 DIAGNOSIS — J111 Influenza due to unidentified influenza virus with other respiratory manifestations: Secondary | ICD-10-CM

## 2024-08-11 MED ORDER — OSELTAMIVIR PHOSPHATE 75 MG PO CAPS: 75.0000 mg | ORAL_CAPSULE | Freq: Two times a day (BID) | ORAL | 0 refills | Status: AC

## 2024-08-11 NOTE — Progress Notes (Signed)
 "   Virtual Visit Consent   Corey Dodson, you are scheduled for a virtual visit with Mary-Margaret Gladis, FNP, a [facility] provider, today.     Just as with appointments in the office, your consent must be obtained to participate.  Your consent will be active for this visit and any virtual visit you may have with one of our providers in the next 365 days.     If you have a MyChart account, a copy of this consent can be sent to you electronically.  All virtual visits are billed to your insurance company just like a traditional visit in the office.    As this is a virtual visit, video technology does not allow for your provider to perform a traditional examination.  This may limit your provider's ability to fully assess your condition.  If your provider identifies any concerns that need to be evaluated in person or the need to arrange testing (such as labs, EKG, etc.), we will make arrangements to do so.     Although advances in technology are sophisticated, we cannot ensure that it will always work on either your end or our end.  If the connection with a video visit is poor, the visit may have to be switched to a telephone visit.  With either a video or telephone visit, we are not always able to ensure that we have a secure connection.     I need to obtain your verbal consent now.   Are you willing to proceed with your visit today? YES   Corey Dodson has provided verbal consent on 08/11/2024 for a virtual visit (video or telephone).   Mary-Margaret Gladis, FNP   Date: 08/11/2024 11:48 AM   Virtual Visit via Video Note   I, Mary-Margaret Gladis, connected with Corey Dodson ([id number], 1981-07-25) on 08/11/2024 at 12:00 PM EST by a video-enabled telemedicine application and verified that I am speaking with the correct person using two identifiers.  Location: Patient: Virtual Visit Location Patient: Home Provider: Virtual Visit Location Provider: Mobile   I discussed the limitations  of evaluation and management by telemedicine and the availability of in person appointments. The patient expressed understanding and agreed to proceed.    History of Present Illness: Corey Dodson is a 44 y.o. who identifies as a male who was assigned male at birth, and is being seen today for influenza.  HPI: Tested positive for flu at work yesterday  Influenza This is a new problem. The current episode started yesterday. The problem occurs intermittently. The problem has been gradually worsening. Associated symptoms include congestion, fatigue, a fever, headaches and myalgias. Pertinent negatives include no chills. Associated symptoms comments: Temp 101-103. Nothing aggravates the symptoms. He has tried NSAIDs and acetaminophen (dayquil and nyquil) for the symptoms. The treatment provided mild relief.    Review of Systems  Constitutional:  Positive for fatigue and fever. Negative for chills.  HENT:  Positive for congestion.   Musculoskeletal:  Positive for myalgias.  Neurological:  Positive for headaches.    Problems: There are no active problems to display for this patient.   Allergies: Allergies[1] Medications: Current Medications[2]  Observations/Objective: Patient is well-developed, well-nourished in no acute distress.  Resting comfortably  at home.  Head is normocephalic, atraumatic.  No labored breathing.  Speech is clear and coherent with logical content.  Patient is alert and oriented at baseline.  Face flushed Wet cough  Assessment and Plan:  Corey Dodson in today with  chief complaint of influenza  1. Influenza-like illness (Primary) 1. Take meds as prescribed 2. Use a cool mist humidifier especially during the winter months and when heat has been humid. 3. Use saline nose sprays frequently 4. Saline irrigations of the nose can be very helpful if done frequently.  * 4X daily for 1 week*  * Use of a nettie pot can be helpful with this. Follow directions with  this* 5. Drink plenty of fluids 6. Keep thermostat turn down low 7.For any cough or congestion- mucinex 8. For fever or aces or pains- take tylenol or ibuprofen appropriate for age and weight.  * for fevers greater than 101 orally you may alternate ibuprofen and tylenol every  3 hours.   Meds ordered this encounter  Medications   oseltamivir  (TAMIFLU ) 75 MG capsule    Sig: Take 1 capsule (75 mg total) by mouth 2 (two) times daily.    Dispense:  10 capsule    Refill:  0    Supervising Provider:   MARYANNE CHEW A [[id number]]     Follow Up Instructions: I discussed the assessment and treatment plan with the patient. The patient was provided an opportunity to ask questions and all were answered. The patient agreed with the plan and demonstrated an understanding of the instructions.  A copy of instructions were sent to the patient via MyChart.  The patient was advised to call back or seek an in-person evaluation if the symptoms worsen or if the condition fails to improve as anticipated.  Time:  I spent 12 minutes with the patient via telehealth technology discussing the above problems/concerns.    Mary-Margaret Gladis, FNP    [1] No Known Allergies [2] No current outpatient medications on file.  "

## 2024-08-11 NOTE — Patient Instructions (Signed)
